# Patient Record
Sex: Female | Born: 1970 | Race: White | Hispanic: No | Marital: Married | Smoking: Never smoker
Health system: Southern US, Community
[De-identification: ages and names within clinical notes are randomized; demographics above are authoritative.]

## PROBLEM LIST (undated history)

## (undated) DIAGNOSIS — I471 Supraventricular tachycardia, unspecified: Secondary | ICD-10-CM

## (undated) HISTORY — DX: Supraventricular tachycardia, unspecified: I47.10

## (undated) HISTORY — PX: SPINAL FUSION: SHX223

## (undated) HISTORY — DX: Supraventricular tachycardia: I47.1

---

## 1999-03-02 ENCOUNTER — Emergency Department (HOSPITAL_COMMUNITY): Admission: EM | Admit: 1999-03-02 | Discharge: 1999-03-02 | Payer: Self-pay | Admitting: Emergency Medicine

## 1999-08-20 ENCOUNTER — Emergency Department (HOSPITAL_COMMUNITY): Admission: EM | Admit: 1999-08-20 | Discharge: 1999-08-20 | Payer: Self-pay | Admitting: *Deleted

## 1999-12-08 ENCOUNTER — Emergency Department (HOSPITAL_COMMUNITY): Admission: EM | Admit: 1999-12-08 | Discharge: 1999-12-08 | Payer: Self-pay | Admitting: Emergency Medicine

## 2000-10-25 HISTORY — PX: KIDNEY STONE SURGERY: SHX686

## 2003-10-26 HISTORY — PX: APPENDECTOMY: SHX54

## 2004-02-06 ENCOUNTER — Inpatient Hospital Stay (HOSPITAL_COMMUNITY): Admission: RE | Admit: 2004-02-06 | Discharge: 2004-02-10 | Payer: Self-pay | Admitting: Neurological Surgery

## 2004-03-30 ENCOUNTER — Encounter: Admission: RE | Admit: 2004-03-30 | Discharge: 2004-03-30 | Payer: Self-pay | Admitting: Neurological Surgery

## 2004-05-11 ENCOUNTER — Encounter: Admission: RE | Admit: 2004-05-11 | Discharge: 2004-05-11 | Payer: Self-pay | Admitting: Neurological Surgery

## 2004-08-29 ENCOUNTER — Inpatient Hospital Stay (HOSPITAL_COMMUNITY): Admission: EM | Admit: 2004-08-29 | Discharge: 2004-08-30 | Payer: Self-pay | Admitting: Family Medicine

## 2004-08-29 ENCOUNTER — Encounter (INDEPENDENT_AMBULATORY_CARE_PROVIDER_SITE_OTHER): Payer: Self-pay | Admitting: *Deleted

## 2004-10-21 ENCOUNTER — Inpatient Hospital Stay (HOSPITAL_COMMUNITY): Admission: RE | Admit: 2004-10-21 | Discharge: 2004-10-25 | Payer: Self-pay | Admitting: Neurological Surgery

## 2004-11-03 ENCOUNTER — Encounter: Admission: RE | Admit: 2004-11-03 | Discharge: 2004-11-03 | Payer: Self-pay | Admitting: Neurological Surgery

## 2004-12-25 ENCOUNTER — Other Ambulatory Visit: Admission: RE | Admit: 2004-12-25 | Discharge: 2004-12-25 | Payer: Self-pay | Admitting: Gynecology

## 2004-12-28 ENCOUNTER — Encounter: Admission: RE | Admit: 2004-12-28 | Discharge: 2004-12-28 | Payer: Self-pay | Admitting: Neurological Surgery

## 2005-02-14 ENCOUNTER — Emergency Department (HOSPITAL_COMMUNITY): Admission: EM | Admit: 2005-02-14 | Discharge: 2005-02-14 | Payer: Self-pay | Admitting: Emergency Medicine

## 2005-03-08 ENCOUNTER — Encounter: Admission: RE | Admit: 2005-03-08 | Discharge: 2005-03-08 | Payer: Self-pay | Admitting: Neurological Surgery

## 2005-05-06 ENCOUNTER — Encounter: Admission: RE | Admit: 2005-05-06 | Discharge: 2005-05-06 | Payer: Self-pay | Admitting: Internal Medicine

## 2005-07-01 ENCOUNTER — Encounter: Admission: RE | Admit: 2005-07-01 | Discharge: 2005-07-01 | Payer: Self-pay | Admitting: Neurological Surgery

## 2005-11-20 ENCOUNTER — Emergency Department (HOSPITAL_COMMUNITY): Admission: EM | Admit: 2005-11-20 | Discharge: 2005-11-20 | Payer: Self-pay | Admitting: Emergency Medicine

## 2005-12-01 IMAGING — RF DG LUMBAR SPINE 2-3V
1 series · 2 of 2 positions shown · non-contrast
Comparison: none

CLINICAL DATA: L4-5 PLIF.
LUMBAR SPINE-TWO VIEWS:
Please note that these films are submitted postoperatively for interpretation.
Frontal and lateral intraoperative views of the lower lumbar spine demonstrates posterior rod and bipedicular screw fixation with interbody fusion material at L[DATE]-S1.  No definite complicating features are identified.

[Series 1: run · 2 of 2 slices shown]
[im 1/2]
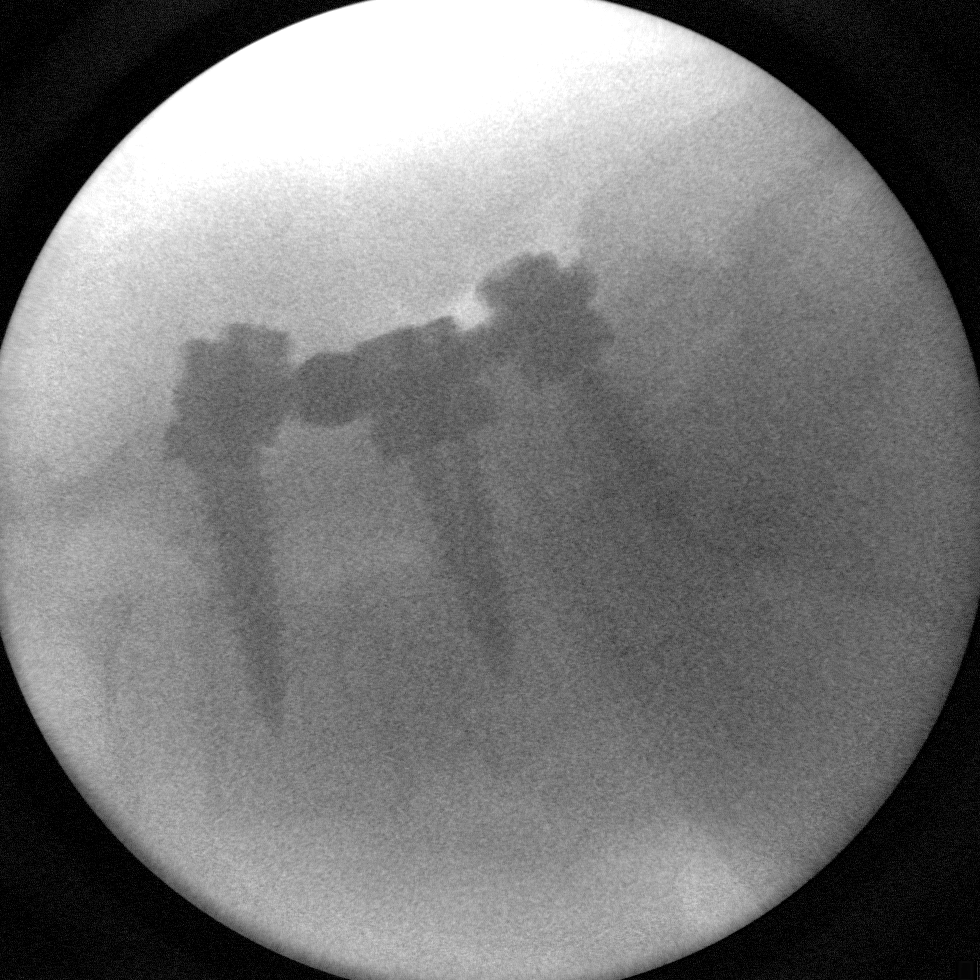
[im 2/2]
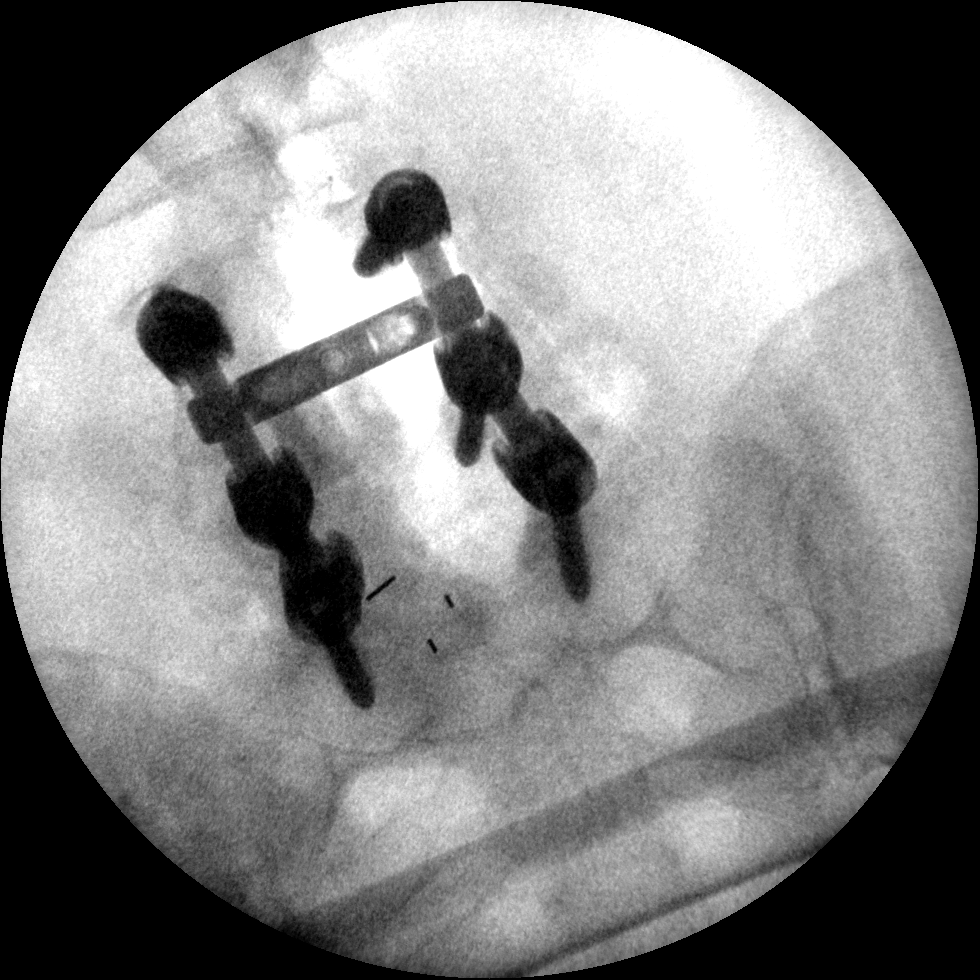

[2 of 2 positions shown; findings below may reference images not displayed]

IMPRESSION: Posterior fusion L-4 to S-1.

## 2006-04-07 ENCOUNTER — Ambulatory Visit (HOSPITAL_COMMUNITY): Admission: RE | Admit: 2006-04-07 | Discharge: 2006-04-07 | Payer: Self-pay | Admitting: Neurosurgery

## 2006-04-21 ENCOUNTER — Ambulatory Visit (HOSPITAL_COMMUNITY): Admission: RE | Admit: 2006-04-21 | Discharge: 2006-04-22 | Payer: Self-pay | Admitting: Neurosurgery

## 2006-08-19 ENCOUNTER — Ambulatory Visit (HOSPITAL_COMMUNITY): Admission: RE | Admit: 2006-08-19 | Discharge: 2006-08-19 | Payer: Self-pay | Admitting: Neurosurgery

## 2006-10-19 ENCOUNTER — Ambulatory Visit (HOSPITAL_COMMUNITY): Admission: RE | Admit: 2006-10-19 | Discharge: 2006-10-19 | Payer: Self-pay | Admitting: Neurosurgery

## 2006-10-25 ENCOUNTER — Emergency Department (HOSPITAL_COMMUNITY): Admission: EM | Admit: 2006-10-25 | Discharge: 2006-10-25 | Payer: Self-pay | Admitting: Emergency Medicine

## 2006-10-25 HISTORY — PX: GALLBLADDER SURGERY: SHX652

## 2006-11-15 ENCOUNTER — Ambulatory Visit (HOSPITAL_COMMUNITY): Admission: RE | Admit: 2006-11-15 | Discharge: 2006-11-16 | Payer: Self-pay | Admitting: Neurosurgery

## 2006-12-31 IMAGING — CR DG LUMBAR SPINE COMPLETE 4+V
5 series · 5 of 5 positions shown · non-contrast
Comparison: 07/01/05.

CLINICAL DATA: Status post fall; back pain.
 LUMBAR SPINE ? 4 VIEWS:

[view not recorded (1 of 5)]
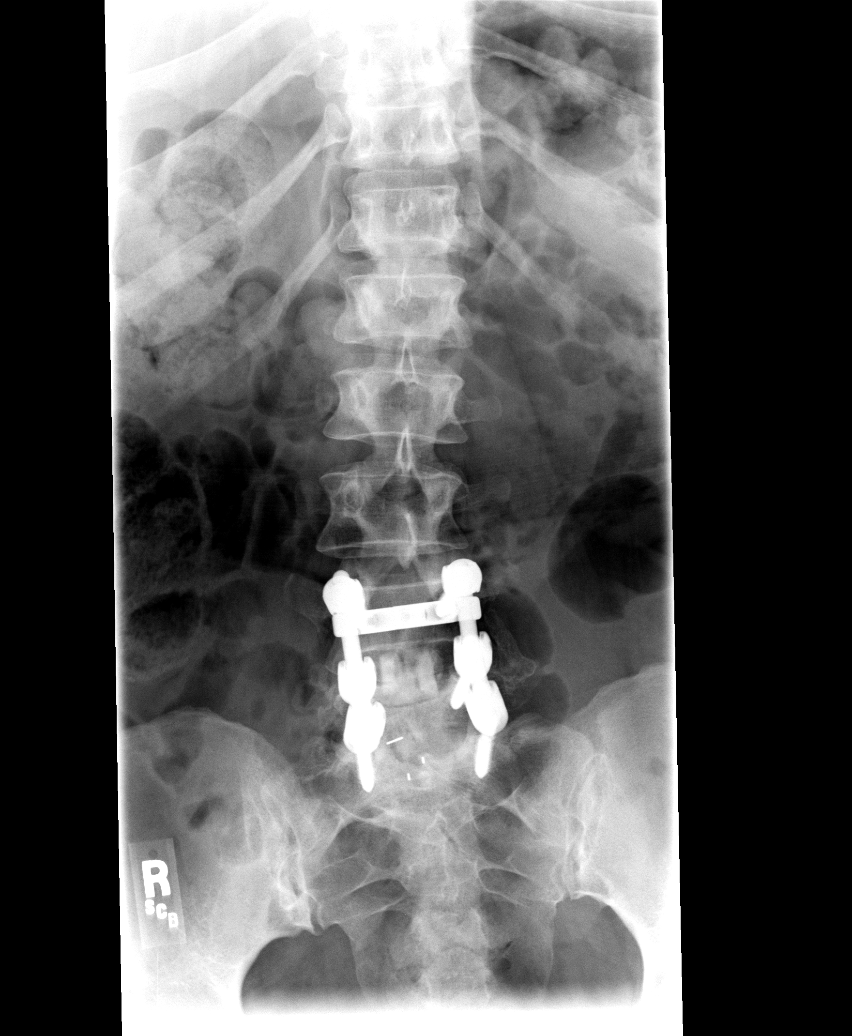

[view not recorded (2 of 5)]
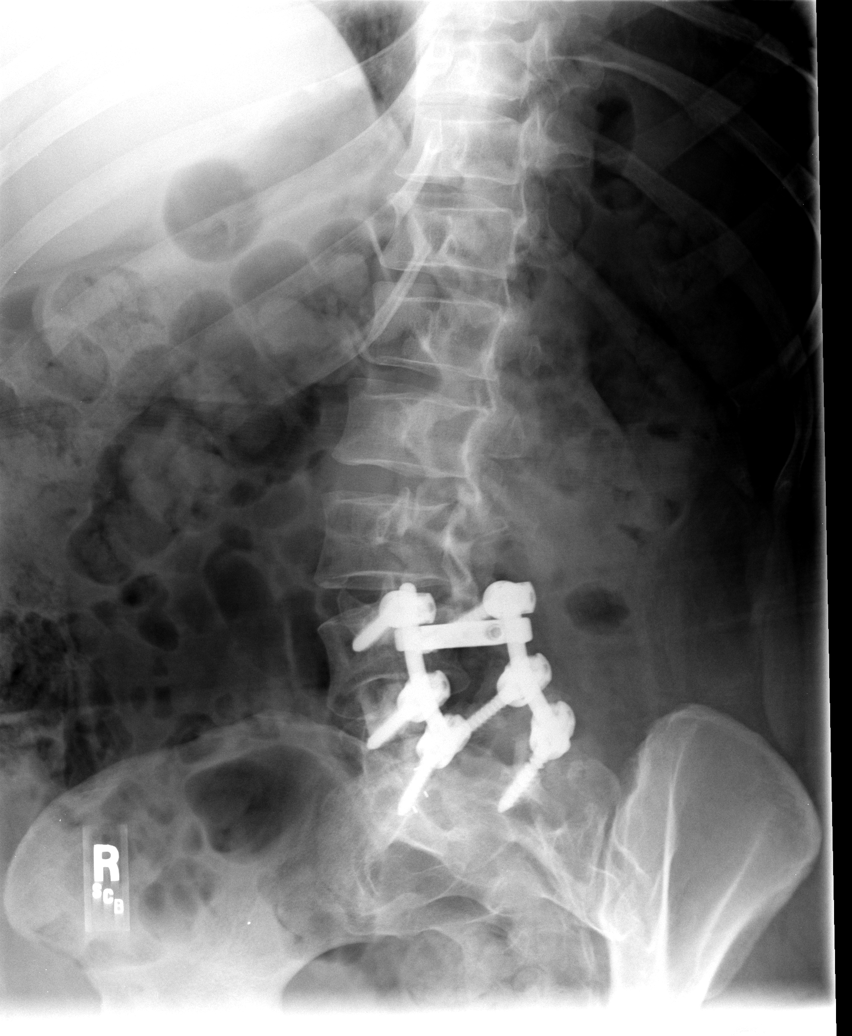

[view not recorded (3 of 5)]
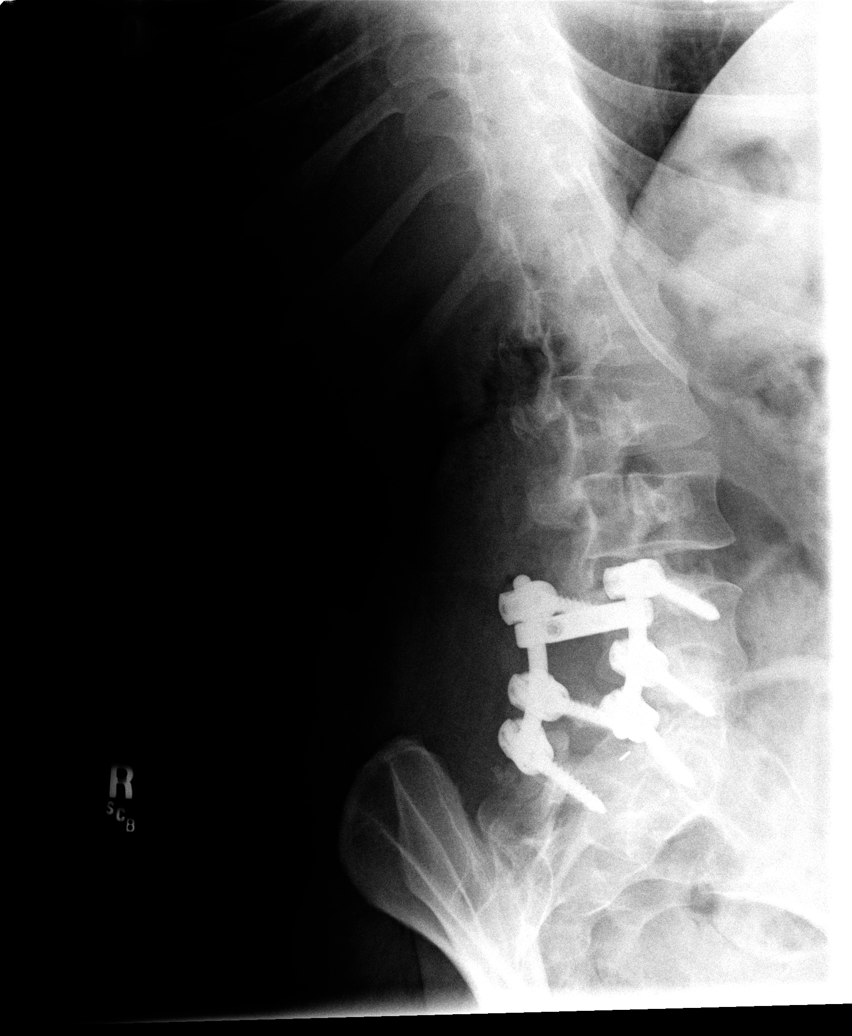

[view not recorded (4 of 5)]
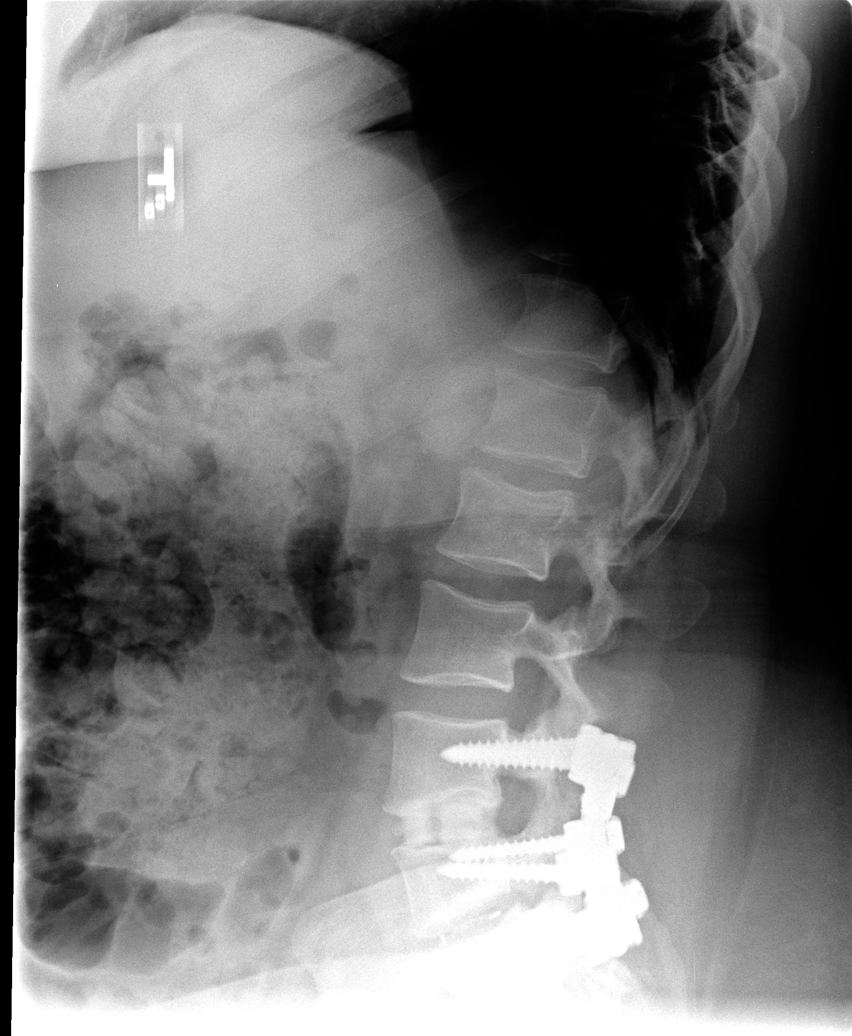

[view not recorded (5 of 5)]
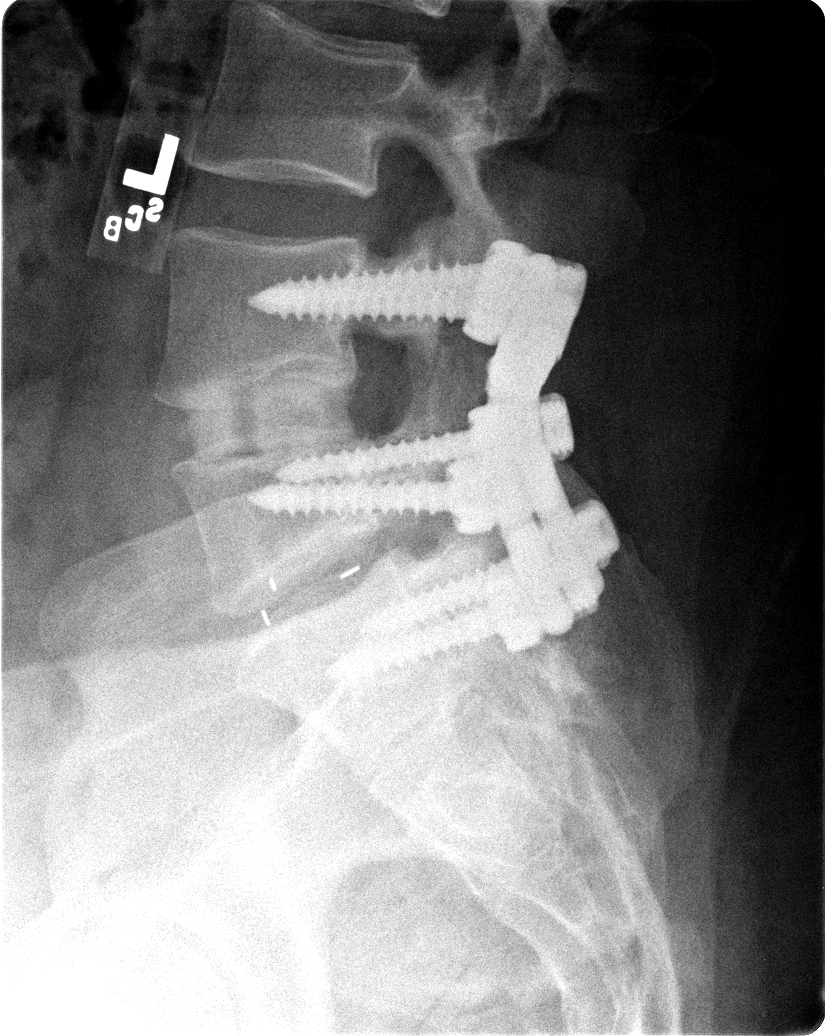

[5 of 5 positions shown; findings below may reference images not displayed]

FINDINGS: PLIF has been performed from the L4 through S1 level.  When compared with the examination dated 07/01/05, there has been further interval incorporation of interfusion body plug into the disk space at L4-5. Cage material is seen at the L5-S1 level.  This is stable in appearance.  The transpedicular screws and Harrington rods are unchanged in appearance.
 The alignment of the lumbar spine is normal.  The vertebral body heights and disk spaces are well preserved.  No acute findings are identified.  Specifically, I see no fractures or dislocations.
IMPRESSION: Stable posterior laminectomy and interbody fusion from L4 through S1.  No acute findings noted.

## 2007-06-01 IMAGING — RF DG THORACIC SPINE 1V
1 series · 1 of 1 positions shown · non-contrast
Comparison: none

CLINICAL DATA: Stimulator insertion

Thoracic spine intraoperative:
Single intraoperative image from C-arm fluoroscopy documents placement of a
spinal stimulator, projecting over the thoracic spine.

[Series 1: run · 1 of 1 slices shown]
[im 1/1]
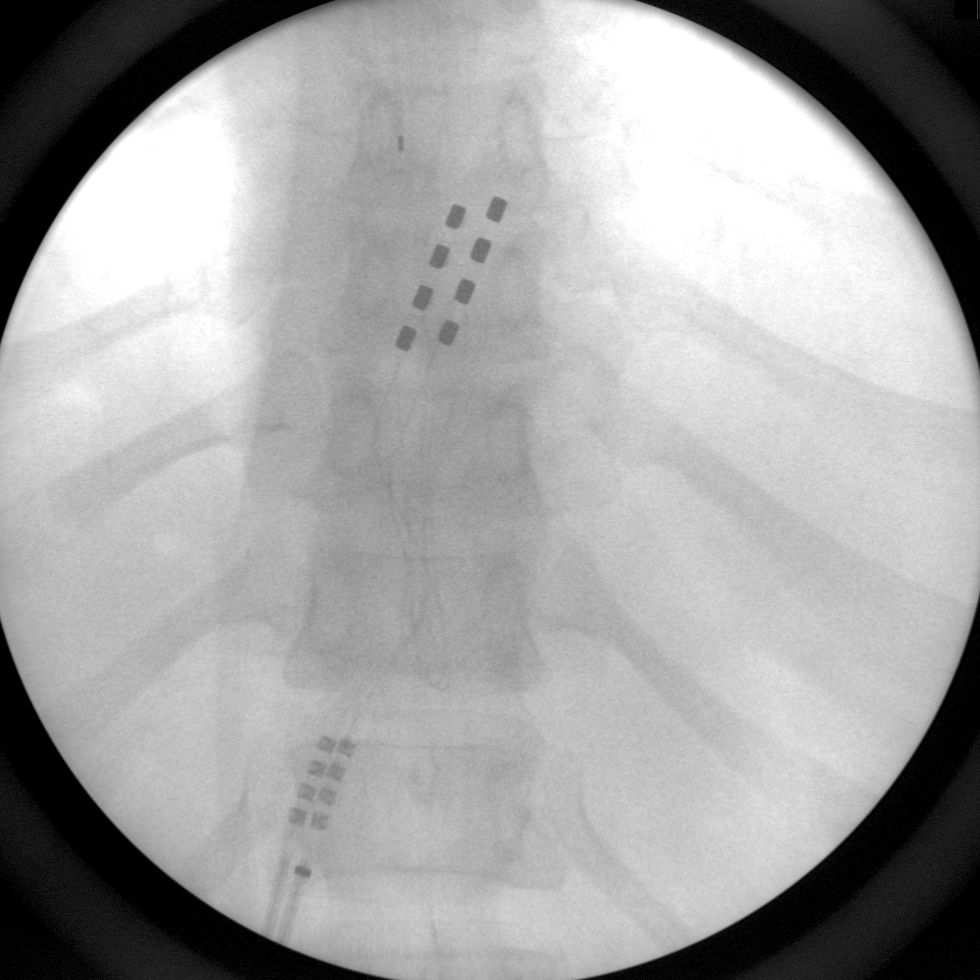

[1 of 1 positions shown; findings below may reference images not displayed]

IMPRESSION: Thoracic stimulator placement

## 2007-07-17 ENCOUNTER — Encounter: Admission: RE | Admit: 2007-07-17 | Discharge: 2007-07-17 | Payer: Self-pay | Admitting: Neurological Surgery

## 2010-09-09 ENCOUNTER — Ambulatory Visit: Payer: Self-pay | Admitting: Obstetrics and Gynecology

## 2011-02-16 ENCOUNTER — Ambulatory Visit: Payer: Self-pay

## 2011-03-12 NOTE — Discharge Summary (Signed)
NAMEBLANCH, STANG                       ACCOUNT NO.:  [id number]   MEDICAL RECORD NO.:  [id number]                   PATIENT TYPE:  INP   LOCATION:  3006                                 FACILITY:  [facility]   PHYSICIAN:  Tia Alert, MD                  DATE OF BIRTH:  July 18, 1971   DATE OF ADMISSION:  02/06/2004  DATE OF DISCHARGE:  02/10/2004                                 DISCHARGE SUMMARY   ADMISSION DIAGNOSES:  Lytic spondylolisthesis, L5-S1, with degenerative disk  disease, back pain, and radiculopathy.   PROCEDURE:  TLIF, L5-S1.   BRIEF HISTORY OF PRESENT ILLNESS:  Patricia [name] is a 40 year old white female  who presented to the neurosurgical clinic with complaints of back pain and  right leg pain in a S1 distribution. She had been followed by Dr. Joaquim Nam  for quite some time and had been treated medically. She had a MRI and a  diskogram which showed a Grade I spondylolisthesis at L5-S1 with lytic pars  defects. Because of her back pain and radiculopathy and her segmental  instability, I recommended a Gill decompression and instrumented fusion at  L5-S1. She understood the risks, benefits, and alternatives and wished to  proceed.   HOSPITAL COURSE:  The patient was admitted on February 06, 2004 and taken to  the operating room where she underwent a Gill decompression and instrumented  fusion at L5-S1. The patient tolerated the procedure well, and she was taken  to the recovery room and then to the floor in stable condition. For details  of the operative procedure, please see the dictated operative note. The  patient's hospital course was routine. There were no complications. She  remained at bedrest the first night with a Foley catheter in place. The  following morning, she was allowed out of bed. Her Foley catheter was  discontinued. She began to mobilize. Her followup hemoglobin was 11.0. She  remained afebrile with stable vital signs. She tolerated a regular diet. She  continued to increase her activities and was discharged to home on February 10, 2004.   DISCHARGE MEDICATIONS:  1. Percocet 5/325 1 to 2 p.o. q.6h. p.r.n. pain, 100 pills with no refills.  2. Valium 5 mg p.o. t.i.d. p.r.n. spasms, 60 pills and 2 refills.  3. Phenergan 25 mg p.o. or p.r. q.6h. p.r.n. nausea, 20 pills with no     refill.   FOLLOW UP:  Her followup is in two weeks with me.   FINAL DIAGNOSES:  Lytic spondylolisthesis, L5-S1, status post TLIF L5-S1.                                                Tia Alert, MD    DSJ/MEDQ  D:  02/10/2004  T:  02/11/2004  Job:  [id number]

## 2011-03-12 NOTE — Op Note (Signed)
Patricia [name], Patricia [name]             ACCOUNT NO.:  [id number]   MEDICAL RECORD NO.:  [id number]          PATIENT TYPE:  AMB   LOCATION:  SDS                          FACILITY:  [facility]   PHYSICIAN:  Reinaldo Meeker, M.D. DATE OF BIRTH:  Apr 06, 1971   DATE OF PROCEDURE:  04/07/2006  DATE OF DISCHARGE:                                 OPERATIVE REPORT   PREOPERATIVE DIAGNOSIS:  Failed back syndrome.   POSTOPERATIVE DIAGNOSIS:  Failed back syndrome.   PROCEDURE:  1.  Percutaneous spinal stimulator trial.  2.  Programming greater than one hour.   SURGEON:  Reinaldo Meeker, M.D.   DESCRIPTION OF PROCEDURE:  After being placed in the prone position, the  patient's back was prepped and draped in the usual sterile fashion.  Local  anesthetic was infiltrated after AP and lateral fluoroscopy showed a good  entry point.   A Tuohy needle was passed down into the epidural space.  A loss of  resistance syringe was used to confirm placement.  Many attempts to pass the  lead in the epidural space were not successful in spite of changing the  wire, etc.  Rather than stopping the procedure, it was elected to remove the  Tuohy needle once and do a more flat angle of entry.  This was done, and  this time we were able to pass the lead into the appropriate position.  Testing was then carried out, and the patient felt some good tingling down  her right leg which is one of her more symptomatic areas.  It was elected,  therefore, to leave the lead in this position.  The wire was removed  followed by the Tuohy needle, and then a small locking collar was placed  around the lead and the lead secured to the skin in two places.  A sterile  dressing was then applied, and the patient was taken to the recovery room in  stable condition.           ______________________________  Reinaldo Meeker, M.D.     ROK/MEDQ  D:  04/07/2006  T:  04/07/2006  Job:  [id number]

## 2011-03-12 NOTE — Op Note (Signed)
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   PROCEDURE:  1.  Exploration of fusion, L5-S1.  2.  Removal of hardware, L5-S1.  3.  Decompressive lumbar laminectomy, hemifacetectomy, and foraminotomies,      L4-5 for central canal and nerve root decompression, requiring a more      extensive laminectomy than would otherwise needed to be done for simple      posterior lumbar interbody fusion.  4.  Posterior lumbar interbody fusion at L4-5 with tangent allograft bone      wedges, local autograft, and DBM putty.  5.  Inner transverse fusion L4 to S1 utilizing local allograft and DBM      putty.  6.  Segmental fixation with reinsertion of hardware L4 to S1 bilaterally      utilizing Sofamor Danek screws.   SURGEON:  Dr. Marikay Alar.   ASSISTANT:  Dr. Donalee Citrin   ANESTHESIA:  General endotracheal.   COMPLICATIONS:  None apparent.   INDICATION FOR THE PROCEDURE:  [name] is a 40 year old white female, who  had undergone a previous PLIF at L5-S1 about 8-9 months ago.  She  preoperatively had a lytic spondylolistheis at L5 and S1 but had a diskogram  which was positive for diskogenic back pain at L4-5 and L5-S1, and I  recommended a simple PLIF at L5-S1 at that time given her young age and the  fact that I felt most of her symptoms were likely coming from the lytic  spondylolisthesis.  We decided to forego the fusion  at L4-5 at that time.  Following the surgery, she had a slowly progressive declining course where  she had more and more back pain.  I felt this was likely secondary to  adjacent level pressures created by the fusion of L5-S1 on an already  degenerated disk that had a positive diskogram prior to her first procedure.  She also had a follow up CT myelogram which showed no evidence of hardware  failure or screw loosening or pseudoarthrosis, but it did show some adjacent  level stenosis at L4-5 with compression of the bilateral L5 nerve roots.  I  recommended a lumbar reexploration with posterior lumbar antibody fusion at  L4-5 with reinsertion of hardware for L4 to S1.  She understood the risks,  benefits, and expected outcome and wished to proceed.   DESCRIPTION OF PROCEDURE:  The patient was taken to the operating room and  after the induction of adequate generalized endotracheal anesthesia, she was  rolled in  the prone position on the [employer] and all pressure points  were padded, and her lumbar region was prepped with DuraPrep and then draped  in the usual sterile fashion.  Local anesthesia 10 mL was injected, and then  a dorsal midline incision was made and carried down to the lumbar sacral  fascia.  The fascia was opened, and the paraspinous musculature was taken  down in a subperiosteal fashion to expose the L4-5 interspace and the L5-S1  interspace.  I dissected out over the transverse processes of L4 and then  was able to identify the hardware inferiorly at L5 and S1.  There was quite  a bit of scar tissue.  The locking cap at L5 on the right was noted to be  loose, and this was removed.  We then removed the cross-link, removed the  other locking caps, and the rocks.  We then tested the screws with a Leksell  rongeur, and these seemed to be solid and had a good nice, tight feel.  Once  the exploration and the fusion was done and the hardware removal was done at  L5-S1, we used  the combination of the Leksell rongeur and the Kerrison  punches to perform a complete laminectomy, hemifacetectomy, and  foraminotomies at L4-5.  The L4 and L5 nerve roots were identified and  decompressed.  Once this decompression was complete, we coagulated the  epidural venous vasculature, cut the disk space bilaterally, and performed  the initial diskectomy with the pituitary rongeurs and then distracted the  disk space up to 12 mm.  And then on the patient's right side, we prepared  the endplates utilizing the round scraper, curettes, pituitary rongeurs, and  then the 12 mm cutting chisel.  We then placed a 12 x 26 mm tangent  allograft bone wedged into the interspace at L4-5 on the right side.  On the  left side, we incised the disk space and prepared the endplates in the same  way utilizing the rotating cutter, round scraper, and 12 mm cutting chisel.  We then packed the midline with local autograft mixed with DBM putty and  then used another 12 x 26 mm tangent allograft bone wedge and tapped this  into the interspace at L4-5 on the left.  Once the PLIF was complete, we  turned our attention to the segmental fixation.  We localized the pedicle  screw zones at L4 bilaterally.  We probed each pedicle, tapped each pedicle  with a 5.5, and then placed six 5 x 40 mm pedicle screws into the pedicles  of L4 bilaterally and checked these with fluoroscopy.  We then decorticated  the transverse processes of L4 and L5 and the sacral ala bilaterally and  used a mixture of local autograft and DBM putty and placed these out over  these for intertransverse arthrodesis from L4 to S1.  We then used two  Lordotic rods and placed these into the multi-axial screw heads of L4, L5,  and S1 pedicle screws and locked these into position with the locking caps  and the antitorque device.  We then placed a separate cross-link for added  stability.  We then irrigated with copious amounts of  Bacitracin-containing saline solution, inspected the nerve roots once again, and then placed a  medium Hemovac drain through a separate stab incision and closed the muscle  and then the fascia with interrupted #1 Vicryl.  We closed the subcutaneous  and subcuticular tissue with 2-0 and 3-0 Vicryl and  closed the skin with  Benzoin and Steri-Strips.  The drapes were removed.  A sterile dressing was  applied.  The patient was awakened from general anesthesia and transported  to the recovery room in stable condition.  At the end of the procedure, all  sponge, needle, and instrument counts were correct.      Markus.Osmond   DSJ/MEDQ  D:  10/21/2004  T:  10/21/2004  Job:  [id number]

## 2011-03-12 NOTE — Op Note (Signed)
Patricia [name], Patricia [name]             ACCOUNT NO.:  [id number]   MEDICAL RECORD NO.:  [id number]          PATIENT TYPE:  INP   LOCATION:  1824                         FACILITY:  [facility]   PHYSICIAN:  Sharlet Salina T. Hoxworth, M.D.DATE OF BIRTH:  19-Oct-1971   DATE OF PROCEDURE:  08/29/2004  DATE OF DISCHARGE:                                 OPERATIVE REPORT   PREOPERATIVE DIAGNOSIS:  Acute appendicitis.   POSTOPERATIVE DIAGNOSIS:  Acute appendicitis.   PROCEDURE:  Laparoscopic appendectomy.   ANESTHESIA:  General.   INDICATIONS FOR PROCEDURE:  Ms. Kristine Linea is a 40 year old female who presents  with acute right lower quadrant abdominal pain and CT scan has shown  evidence of acute appendicitis.  Laparoscopic appendectomy has been  recommended and accepted.  The procedure, its indications, risks of bleeding  and infection were discussed and understood. She is now brought to the  operating room for this procedure.   DESCRIPTION OF PROCEDURE:  The patient was taken to the operating room,  placed in the  supine position on the operating table, and general  endotracheal anesthesia was induced. She received preoperative antibiotics.  Foley catheter was placed. The abdomen was widely sterilely prepped and  draped.  Local anesthesia was infiltrated at trocar sites prior to the  incisions.   A 1 cm incision was made just above the umbilicus and dissection carried  down to the midline fascia, which was incised for 1 cm with peritoneum  entered under direct vision.  Through a mattress suture of  0-Vicryl, the Hasson trocar was placed and pneumoperitoneum established.  Under direct vision, a 5 mm trocar was placed in the right upper quadrant  and a 12 mm trocar in the left lower quadrant.  There was a small amount of  cloudy fluid in the right pericolic gutter.  The appendix was found, coming  off the anterior portion of the cecum, and was acutely inflamed with  exudate, but not perforated.  There  were inflammatory attachments laterally,  inferiorly that were broken up with careful blunt dissection.   The appendix at this point was quite free, down to the base, which was not  inflamed.  The mesoappendix was then sequentially divided between the  Harmonic Scalpel until the appendix was completely freed down to its base.  The appendix was then separated from the cecum with a single firing of 35 mm  blue load, and the stapler fired across the base.  Staple line was intact without bleeding.  The appendix was placed in an Endo-  catch bag and brought out through the umbilicus. The right lower quadrant  was thoroughly irrigated and suctioned until clear, hemostasis assured.  Trocars  were removed under direct vision and all CO2 evacuated.  Mattress suture was  secured at the umbilicus.  Skin incisions were closed with interrupted  subcuticular 4-0 Vicryl and Steri-Strips. Sponge and needle counts were  correct.  The patient was taken to recovery in good condition.       BTH/MEDQ  D:  08/29/2004  T:  08/30/2004  Job:  [id number]

## 2011-03-12 NOTE — Discharge Summary (Signed)
[name], [name]             ACCOUNT NO.:  [id number]   MEDICAL RECORD NO.:  [id number]          PATIENT TYPE:  INP   LOCATION:  3009                         FACILITY:  [facility]   PHYSICIAN:  Tia Alert, MD     DATE OF BIRTH:  10-31-1970   DATE OF ADMISSION:  10/21/2004  DATE OF DISCHARGE:  10/25/2004                                 DISCHARGE SUMMARY   ADMISSION DIAGNOSIS:  Adjacent level stenosis with degenerative disk  disease, L4-5 with severe back pain.   PROCEDURE:  Lumbar reexploration with exploration of fusion, L5-S1 with  decompression and instrument effusion at L4-S1.   HISTORY OF PRESENT ILLNESS:  [name] is a 40 year old white female who  had undergone a previous PLIF at L5-S1.  She had a previous diskogram which  was positive at L4-5 and L5-S1.  However, she had a loaded spondylolisthesis  at L5-S1 and I felt that her pain was most likely coming mostly from the L5-  S1 level.  However postoperatively, she was unable to get off narcotic pain  medications.  She had continued severe back pain.  She had a CT myelogram  which showed some adjacent level stenosis at L4-5.  Because of her continued  symptoms and her positive diskogram and her developing adjacent level  stenosis at L4-L5, I recommended a lumbar reexploration of a decompression  at L4-5 followed by instrument effusion of L4-S1.  She understood the risks,  benefits, expected outcome and wished to proceed.   HOSPITAL COURSE:  The patient was admitted on October 21, 2004, and taken  to the operating room where she underwent a PLIF at L4-5 with extension of  her fusion.  The patient tolerated the procedure well and was taken to the  recovery room and then to the floor in stable condition.  For the details of  the operative procedure, please see the dictated operative note.  The  patient's hospital course was routine.  There were no complications.  She  slowly began to increase her activities over her four  days in the hospital  to the point that she was ambulating in the halls without difficulty.  She  had good dorsi and plantar flexion and strength.  She had no numbness,  tingling or weakness or any leg pain.  She complained of significant back  soreness.  She had a PCA for the first couple days for which she received  Dilaudid.  She was then taken off the Dilaudid PCA and started back on her  OxyContin, Percocet and Valium that she was taken at home.  Her  postoperative hemoglobin was 11.2 on postoperative day #2.  Her incision  remained clean, dry and intact.  She continued along this course and was  discharged to home in stable condition on postoperative day #4, which was  10/25/04.   DISCHARGE MEDICATIONS:  1.  OxyContin 40 mg p.o. b.i.d. p.r.n. pain.  2.  Percocet 5/325 mg one to two p.o. q.6h. p.r.n. pain.  3.  Valium 5 mg one to two p.o. t.i.d. p.r.n. muscle spasm.   FOLLOWUP PLAN:  Her return  appointment is in one week with x-rays.  She was  asked to call for any unusual redness, tenderness, swelling or drainage from  her wound or any temperature of 101.5.  All of her discharge instructions  were explained to the patient.  She demonstrated an understanding and all of  her questions were answered to her satisfaction.   DISPOSITION:  To home.   CONDITION ON DISCHARGE:  Stable.   FINAL DIAGNOSIS:  Degenerative disk disease, adjacent stenosis and back  pain, status posterior lumber interbody fusion at L4-5 with extension of  fusion.      Patricia [name]   DSJ/MEDQ  D:  10/25/2004  T:  10/25/2004  Job:  [id number]

## 2011-03-12 NOTE — H&P (Signed)
[name], [name]             ACCOUNT NO.:  [id number]   MEDICAL RECORD NO.:  [id number]          PATIENT TYPE:  INP   LOCATION:  1824                         FACILITY:  [facility]   PHYSICIAN:  Sharlet Salina T. Hoxworth, M.D.DATE OF BIRTH:  05/17/1971   DATE OF ADMISSION:  08/29/2004  DATE OF DISCHARGE:                                HISTORY & PHYSICAL   CHIEF COMPLAINT:  Right lower quadrant abdominal pain.   HISTORY OF PRESENT ILLNESS:  [name] is a 40 year old white female who  presents to the [facility] emergency room with an approximately five-day  long illness. This began initially with what she describes as flu symptoms  with generalized aching, nausea, fever, and diarrhea. These symptoms  continued for several days, but then last night about 12 hours prior to  admission developed more severe right lower quadrant abdominal pain which  has persisted. She remains nauseated and has had several episodes of  vomiting. The pain is worse with any motion. She continues to have some  fever. She denies any history of chronic GI complaints or abdominal pain.   PAST MEDICAL HISTORY:  She has had a spinal fusion and has chronic back  pain. She has had bilateral tubal ligation and cystoscopy for kidney stones.   MEDICATIONS:  1.  Valium 5 mg b.i.d.  2.  Oxycodone 20 mg b.i.d.  3.  Combidex 5 mg t.i.d.   ALLERGIES:  SULFA ANTIBIOTICS and some TAPE.   SOCIAL HISTORY:  Married. No alcohol or cigarette use.   FAMILY HISTORY:  Noncontributory.   REVIEW OF SYSTEMS:  Positive fever and malaise with this illness.  RESPIRATORY: Denies shortness of breath, cough, or wheezing, or chest pain.  Denies palpitations. History of heart disease. ABDOMEN/GI: As above.  GU: No  urinary burning, frequency, or bleeding. MUSCULOSKELETAL: Significant for  chronic back pain.   PHYSICAL EXAMINATION:  VITAL SIGNS: Temperature 98.1, pulse 115,  respirations 20, blood pressure 132/93.  GENERAL: She is an  obese female who appears ill and in pain.  SKIN: Warm and dry without rash or infection.  HEENT: No mass or thyromegaly. Sclera anicteric.  LUNGS: Clear to auscultation without any increased work of breathing.  CARDIAC: Regular rate and rhythm without murmurs. No edema.  ABDOMEN: Decreased bowel sounds. There is a well localized significant  tenderness in the right lower quadrant with guarding. No peripheral masses  or organomegaly.  EXTREMITIES: No joint swelling or deformity.  NEUROLOGIC: Alert and oriented. Motor and sensory grossly normal.   LABORATORY DATA:  Pertinent elevated white count of 14.7, normal hemoglobin.  Normal urinalysis. Electrolytes and LFTs unremarkable. CT scan of the  abdomen and pelvis are obtained which show evidence of acute appendicitis  without perforation.   ASSESSMENT/PLAN:  Apparent acute appendicitis. Presentation is a little  unusual, but has significant tenderness in the right lower quadrant. Will  treat with intravenous antibiotics, intravenous fluids, and emergency  appendectomy.       BTH/MEDQ  D:  08/29/2004  T:  08/29/2004  Job:  [id number]

## 2011-03-12 NOTE — Op Note (Signed)
Patricia [name], Patricia [name]             ACCOUNT NO.:  [id number]   MEDICAL RECORD NO.:  [id number]          PATIENT TYPE:  AMB   LOCATION:  SDS                          FACILITY:  [facility]   PHYSICIAN:  Reinaldo Meeker, M.D. DATE OF BIRTH:  06-19-1971   DATE OF PROCEDURE:  10/19/2006  DATE OF DISCHARGE:                               OPERATIVE REPORT   PREOPERATIVE DIAGNOSIS:  Pain secondary to spinal stimulator battery.   POSTOPERATIVE DIAGNOSIS:  Pain secondary to spinal stimulator battery.   PROCEDURE:  Relocation and replacement of spinal stimulator battery  pack.   SURGEON:  Dr. Gerlene Fee.   PROCEDURE IN DETAIL:  After placed in the prone position the patient's  back, buttock and flank were prepped and draped in the usual sterile  fashion.  Previous buttock incision was reopened and carried down to the  battery pack which was removed without difficulty.  It was disconnected  from the extension packet lead.  Fluoroscopy was used to identify the  leads approximately 6 inches proximal to that and a cutdown technique  was used to identify the leads.  These were then withdrawn to that small  opening from the old battery pouch.  A new incision was then made in the  left flank region and area of a fatty soft tissue and a nice pocket was  formed.  The battery was tested in the pocket and found to be a nice  fit.  The lead was then tunneled from the small cut down area over to  the new battery pocket without difficulty and was then attached to the  battery.  The battery was then placed within the pocket found to be a  good fit.  All wounds were then irrigated and closed with interrupted  Vicryl on the subcutaneous and subcuticular tissues and Dermabond on the  skin.  Sterile dressings were applied and the patient was extubated and  taken to recovery room in stable condition.           ______________________________  Reinaldo Meeker, M.D.     ROK/MEDQ  D:  10/19/2006  T:  10/19/2006   Job:  [id number]

## 2011-03-12 NOTE — Op Note (Signed)
Patricia [name], BOMKAMP             ACCOUNT NO.:  [id number]   MEDICAL RECORD NO.:  [id number]          PATIENT TYPE:  AMB   LOCATION:  SDS                          FACILITY:  [facility]   PHYSICIAN:  Reinaldo Meeker, M.D. DATE OF BIRTH:  [birth year]   DATE OF PROCEDURE:  08/19/2006  DATE OF DISCHARGE:  08/19/2006                                 OPERATIVE REPORT   PREOPERATIVE DIAGNOSIS:  Superficial wound irritation from spinal stimulator  battery.   POSTOPERATIVE DIAGNOSIS:  Superficial wound irritation from spinal  stimulator battery.   PROCEDURE:  Revision of spinal stimulator battery pocket.   SURGEON:  Reinaldo Meeker, M.D.   PROCEDURE IN DETAIL:  After being placed in the prone position, the  patient's skin was infiltrated with local anesthetic.  The previous incision  was opened and carried down until the superficial layer superior surface of  the spinal stimulator battery could be identified.  This was dissected free  of soft tissue and actually brought out of the pocket.  The pocket was then  enlarged deep and inferior so that the entire battery would be able to sit  below the incision.  This was generously enlarged with numerous testings of  replacing the battery in there to make sure that it sat smoothly beneath the  skin without any pressure upward where there had been a previous problem.  When this was accomplished, the wound was irrigated with antibiotic  irrigation and the battery replaced back into the pouch.  The wound was then  closed with two layers of interrupted Vicryl on the subcutaneous and  subcuticular tissues and Dermabond on the skin.  A sterile dressing was then  applied and the patient was taken to recovery room in stable condition.           ______________________________  Reinaldo Meeker, M.D.     ROK/MEDQ  D:  08/19/2006  T:  08/20/2006  Job:  [id number]

## 2011-03-12 NOTE — Op Note (Signed)
NAMELUELLA, GARDENHIRE             ACCOUNT NO.:  [id number]   MEDICAL RECORD NO.:  [id number]          PATIENT TYPE:  OIB   LOCATION:  3172                         FACILITY:  [facility]   PHYSICIAN:  Reinaldo Meeker, M.D. DATE OF BIRTH:  12/22/1970   DATE OF PROCEDURE:  04/21/2006  DATE OF DISCHARGE:                                 OPERATIVE REPORT   PREOPERATIVE DIAGNOSIS:  Failed back syndrome.   POSTOPERATIVE DIAGNOSIS:  Failed back syndrome.   PROCEDURE:  1.  Permanent spinal stimulator with paddle lead with thoracic laminectomy.  2.  Implantation with spinal stimulator battery.  3.  Patient programming.   SURGEON:  Reinaldo Meeker, M.D.   ASSISTANT:  None.   PROCEDURE IN DETAIL:  After being placed in the prone position, the  patient's back and upper buttocks were prepped and draped in the usual  sterile fashion.  AP fluoroscopy was used to identify the appropriate level.  Midline incision was made at the spinous processes of T10 and T11.  Using  Bovie cautery current, the incision was carried down to the spinous  processes.  Subperiosteal dissection was carried out on the left side  spinous processes and lamina and self-retaining retractor was placed for  exposure.  X-ray showed approach through a good level and left-sided  hemilaminectomy of T10 was performed.  Attempts to slide the knee into a  good position kept having to lead far off to the left side which was not  felt to be ideal.  Subperiosteal dissection was then carried out on the  right so that bilateral exposure was present.  Spinous process was then  removed and a partial right hemilaminectomy was performed.  Attempts to  place the lead in this position allowed excellent access to both the left  and right side and midline.  A pocket at this time was then made in the  buttock for the battery and fashioned in appropriate fashion.  This was  tested and found to be a good size.  The passer was then passed from a  superior to inferior direction connecting the two incisions and the  extension set was then brought up without difficulty.  The lead was then  placed into good position and then the connections made with  boots being  placed over the extension connection.  The distal end of the lead was then  implanted into the battery.  Both wounds were then irrigated copiously.  They were closed in multiple layers with Vicryl in the muscle, fascia and  subcutaneous subcuticular tissues and staples were placed on the skin.  Prior to placing the last layer closure, one final AP x-ray showed the lead  had not been dislodged at all.  Staples were then placed on the skin.  Sterile dressing was then applied.  The patient was extubated and taken to  recovery room in stable condition.           ______________________________  Reinaldo Meeker, M.D.     ROK/MEDQ  D:  04/21/2006  T:  04/21/2006  Job:  [id number]

## 2011-03-12 NOTE — Op Note (Signed)
Patricia [name], [name]                       ACCOUNT NO.:  [id number]   MEDICAL RECORD NO.:  [id number]                   PATIENT TYPE:  INP   LOCATION:  2899                                 FACILITY:  [facility]   PHYSICIAN:  Tia Alert, MD                  DATE OF BIRTH:  1970-11-30   DATE OF PROCEDURE:  02/06/2004  DATE OF DISCHARGE:                                 OPERATIVE REPORT   PREOPERATIVE DIAGNOSIS:  Lytic spondylolisthesis, L5-S1 with degenerative  disk disease, back pain and radiculopathy.   POSTOPERATIVE DIAGNOSIS:  Lytic spondylolisthesis, L5-S1 with degenerative  disk disease, back pain and radiculopathy.   OPERATION PERFORMED:  1. Gill decompression, L5-S1 for central canal and nerve root decompression     for back pain and radiculopathy.  2. Transforaminal lumbar interbody fusion, L4-5 on the right utilizing a 12     x 26 mm PEEK interbody cage, packed with local autograft and MasterGraft     bone graft extender.  3. Intertransverse arthrodesis, L5-S1 on the right utilizing MasterGraft and     local autograft.  4. Nonsegmental fixation, L5-S1 utilizing the Legacy pedicle screw system.   SURGEON:  Tia Alert, MD   ASSISTANT:  Reinaldo Meeker, M.D.   ANESTHESIA:  General endotracheal.   COMPLICATIONS:  None apparent.   INDICATIONS FOR PROCEDURE:  Patricia [name] is a 40 year old white female who  presented to the neurosurgical clinic with complaints of back pain and right  leg pain in an L5-S1 distribution.  She had been followed by Patricia [name],  M.D. for quite some time and had been treated medically.  She had an MRI and  a discogram which showed a grade 1 spondylolisthesis at L5-S1 with a lytic  pars defect.  Because of her back pain and her radiculopathy and her  segmental instability, I recommended a Gill decompression and instrumented  fusion at L5-S1.  She understood the risks, the benefits, and the expected  outcome and wished to proceed.   DESCRIPTION OF PROCEDURE:  The patient was taken to the operating room and  after induction of adequate general endotracheal anesthesia, she was rolled  into the prone position on the Wilson frame and all pressure points were  padded and the lumbar region was prepped with DuraPrep and then draped in  the usual sterile fashion.  10 mL of local anesthesia was injected and then  a dorsal midline incision was made and carried down to the lumbosacral  fascia.  The fascia was opened and the paraspinous musculature was taken  down in a subperiosteal fashion to expose the L5-S1 interspace, I dissected  out over the transverse processes.  The pars defects were obvious and the  spinous process and lamina were free floating.  I used a combination of a  Leksell and Kerrison punches to remove the spinous process and the lamina  and hemifacets.  She had had  quite a bit of epidural venous vasculature and  adipose tissue which was coagulated and removed to expose the underlying L5  and S1 nerve roots.  Then nerve roots were identified and then I carried the  dissection out to the medial pedicle walls of L5 and S1 bilaterally.  Once  the decompression was complete, I turned my attention to the TLIF.  The  epidural venous vasculature on the right was coagulated and cut and the disk  space was opened on the right side at L5-S1 and a thorough diskectomy  performed with a combination of curets and pituitary rongeurs.  Once the  disk space and the end plates were prepared, we packed the interspace with  local autograft and MasterGraft and then tapped a 12 x 26 mm PEAK interbody  cage into the interspace.  The cage was packed with autograft.  This was  done under fluoroscopic guidance.  Once the TLIF was complete, we turned our  attention to the pedicle screws.  The pedicle screw entry zones at L5 and S1  were identified. We probed each pedicle with a pedicle probe and then tapped  each pedicle with a 5.5 tap  except for the left L5 pedicle was tapped with a  4.5 tap and we used a 5.5 x 35 mm pedicle screw in the pedicles of L5 on the  left.  A 6.5 x 35 mm pedicle screw was used on the right and there was some  splitting of the pedicle.  There was no compression of the nerve root and  the nerve root was separated from the pedicle screw.  6.5 x 30 mm pedicles  screws were used in the sacrum bilaterally.  We then decorticated the  patient on the right in the intertransverse space and packed MasterGraft and  autograft there for intertransverse arthrodesis on the right side.  Because  of her body habitus it was very difficult to do this on the patient's left  side.  We then used two lordotic rods, placed these into the multiaxial  screw heads of the pedicle screws and locked these into position with the  locking caps and the antitorque device and placed a separate crosslink for  added stability.  We then dried all bleeding points, irrigated with saline  solution and placed a medium Hemovac drain through a separate stab incision  and then closed the fascia with interrupted #1 Vicryl. We closed the  subcutaneous and subcuticular tissue with 2-0 and 3-0 Vicryl and closed the  skin with benzoin and Steri-Strips.  The drapes were removed and sterile  dressing was applied.  The patient was awakened from general anesthesia and  transported to the recovery room in stable condition.  At the end of the  procedure, all sponge, needle and instrument counts were correct.                                               Tia Alert, MD    DSJ/MEDQ  D:  02/06/2004  T:  02/07/2004  Job:  [id number]

## 2011-03-12 NOTE — Op Note (Signed)
[name], Patricia             ACCOUNT NO.:  [id number]   MEDICAL RECORD NO.:  [id number]          PATIENT TYPE:  OIB   LOCATION:  3005                         FACILITY:  [facility]   PHYSICIAN:  Reinaldo Meeker, M.D. DATE OF BIRTH:  [birth year]   DATE OF PROCEDURE:  DATE OF DISCHARGE:  11/16/2006                               OPERATIVE REPORT   PREOPERATIVE DIAGNOSES:  Infected spinal stimulator battery.   POSTOPERATIVE DIAGNOSES:   PROCEDURE:  Removal of spinal stimulator battery and extension.   SURGEON:  Reinaldo Meeker, M.D.   PROCEDURE IN DETAIL:  After being placed in the prone posthion, the  patient's back and buttock were prepped and draped in the usual sterile  fashion.  Previous incision where the pocket was formed was reopened.  The stimulator battery removed without difficulty.  There was some  obvious exposure of the extension and it was felt necessary to remove  this as well.  A cut was made higher up.  The extension was  disconnected.  It was then removed without difficulty.  Both wounds were  irrigation copiously and then closed with interrupted Vicryl in the  subcutaneous, subcuticular tissues and a running Nylon on the skin.  Sterile dressings were then applied and the patient was extubated, taken  to recovery room in stable condition.           ______________________________  Reinaldo Meeker, M.D.     ROK/MEDQ  D:  02/02/2007  T:  02/02/2007  Job:  [id number]

## 2011-05-29 ENCOUNTER — Emergency Department: Payer: Self-pay | Admitting: Emergency Medicine

## 2011-05-29 ENCOUNTER — Ambulatory Visit: Payer: Self-pay | Admitting: Emergency Medicine

## 2011-07-24 ENCOUNTER — Ambulatory Visit: Payer: Self-pay | Admitting: Specialist

## 2011-09-10 ENCOUNTER — Other Ambulatory Visit: Payer: Self-pay | Admitting: Neurological Surgery

## 2011-09-10 DIAGNOSIS — M5410 Radiculopathy, site unspecified: Secondary | ICD-10-CM

## 2011-09-21 ENCOUNTER — Ambulatory Visit
Admission: RE | Admit: 2011-09-21 | Discharge: 2011-09-21 | Disposition: A | Discharge status: Home / Self Care | Payer: Self-pay | Source: Ambulatory Visit | Attending: Neurological Surgery | Admitting: Neurological Surgery

## 2011-09-21 VITALS — BP 107/80 | HR 65

## 2011-09-21 DIAGNOSIS — M5410 Radiculopathy, site unspecified: Secondary | ICD-10-CM

## 2011-09-21 MED ORDER — IOHEXOL 180 MG/ML  SOLN
20.0000 mL | Freq: Once | INTRAMUSCULAR | Status: AC | PRN
  Administered 2011-09-21: 20 mL via INTRATHECAL

## 2011-09-21 MED ORDER — HYDROMORPHONE HCL 4 MG PO TABS
4.0000 mg | ORAL_TABLET | Freq: Once | ORAL | Status: AC
  Administered 2011-09-21: 4 mg via ORAL

## 2011-09-21 MED ORDER — DIAZEPAM 5 MG PO TABS
10.0000 mg | ORAL_TABLET | Freq: Once | ORAL | Status: AC
  Administered 2011-09-21: 10 mg via ORAL

## 2011-09-21 NOTE — Progress Notes (Addendum)
Patient states she has been off Trazadone for the past three days.  jkl

## 2011-09-21 NOTE — Progress Notes (Signed)
Husband at bedside.  Tolerating crackers and soda.  Repositioned for comfort and medicated for pain.

## 2011-09-21 NOTE — Patient Instructions (Signed)

## 2012-10-25 HISTORY — PX: TOTAL VAGINAL HYSTERECTOMY: SHX2548

## 2012-12-18 ENCOUNTER — Ambulatory Visit: Payer: Self-pay | Admitting: Nurse Practitioner

## 2013-01-05 ENCOUNTER — Ambulatory Visit: Payer: Self-pay | Admitting: Nurse Practitioner

## 2013-02-28 ENCOUNTER — Other Ambulatory Visit: Payer: Self-pay | Admitting: Orthopedic Surgery

## 2013-02-28 DIAGNOSIS — M25572 Pain in left ankle and joints of left foot: Secondary | ICD-10-CM

## 2013-02-28 DIAGNOSIS — R531 Weakness: Secondary | ICD-10-CM

## 2013-02-28 DIAGNOSIS — R609 Edema, unspecified: Secondary | ICD-10-CM

## 2013-03-04 ENCOUNTER — Ambulatory Visit
Admission: RE | Admit: 2013-03-04 | Discharge: 2013-03-04 | Disposition: A | Discharge status: Home / Self Care | Payer: 59 | Source: Ambulatory Visit | Attending: Orthopedic Surgery | Admitting: Orthopedic Surgery

## 2013-03-04 DIAGNOSIS — R609 Edema, unspecified: Secondary | ICD-10-CM

## 2013-03-04 DIAGNOSIS — M25572 Pain in left ankle and joints of left foot: Secondary | ICD-10-CM

## 2013-03-04 DIAGNOSIS — R531 Weakness: Secondary | ICD-10-CM

## 2013-04-16 ENCOUNTER — Ambulatory Visit: Payer: Self-pay | Admitting: Pain Medicine

## 2013-05-23 ENCOUNTER — Ambulatory Visit: Payer: Self-pay | Admitting: Pain Medicine

## 2013-06-06 ENCOUNTER — Ambulatory Visit: Payer: Self-pay | Admitting: Pain Medicine

## 2013-07-06 ENCOUNTER — Ambulatory Visit: Payer: Self-pay | Admitting: Pain Medicine

## 2013-08-02 ENCOUNTER — Ambulatory Visit: Payer: Self-pay | Admitting: Obstetrics and Gynecology

## 2013-08-02 LAB — BASIC METABOLIC PANEL
Anion Gap: 3 — ABNORMAL LOW (ref 7–16)
Creatinine: 0.7 mg/dL (ref 0.60–1.30)
EGFR (African American): >60
EGFR (Non-African Amer.): >60
Osmolality: 272 (ref 275–301)

## 2013-08-02 LAB — CBC
HGB: 14 g/dL (ref 12.0–16.0)
MCH: 31.3 pg (ref 26.0–34.0)
Platelet: 248 10*3/uL (ref 150–440)

## 2013-08-06 ENCOUNTER — Ambulatory Visit: Payer: Self-pay | Admitting: Obstetrics and Gynecology

## 2013-08-24 ENCOUNTER — Ambulatory Visit: Payer: Self-pay | Admitting: Pain Medicine

## 2014-01-11 ENCOUNTER — Encounter (INDEPENDENT_AMBULATORY_CARE_PROVIDER_SITE_OTHER): Payer: Self-pay

## 2014-01-11 ENCOUNTER — Ambulatory Visit (INDEPENDENT_AMBULATORY_CARE_PROVIDER_SITE_OTHER): Payer: 59 | Admitting: Cardiovascular Disease

## 2014-01-11 ENCOUNTER — Encounter: Payer: Self-pay | Admitting: Cardiovascular Disease

## 2014-01-11 ENCOUNTER — Ambulatory Visit: Payer: 59 | Admitting: Cardiovascular Disease

## 2014-01-11 VITALS — BP 118/98 | HR 75 | Ht 65.0 in | Wt 300.5 lb

## 2014-01-11 DIAGNOSIS — I471 Supraventricular tachycardia: Secondary | ICD-10-CM | POA: Insufficient documentation

## 2014-01-11 DIAGNOSIS — I498 Other specified cardiac arrhythmias: Secondary | ICD-10-CM

## 2014-01-11 MED ORDER — DILTIAZEM HCL 30 MG PO TABS: 30.0000 mg | ORAL_TABLET | Freq: Three times a day (TID) | ORAL | Status: AC | PRN

## 2014-01-11 MED ORDER — METOPROLOL TARTRATE 50 MG PO TABS: 50.0000 mg | ORAL_TABLET | Freq: Two times a day (BID) | ORAL | Status: DC

## 2014-01-11 MED ORDER — PROPRANOLOL HCL 20 MG PO TABS: 20.0000 mg | ORAL_TABLET | Freq: Three times a day (TID) | ORAL | Status: AC | PRN

## 2014-01-11 NOTE — Assessment & Plan Note (Signed)
Recent 50 pound weight loss. Encouraged her to keep a strict diet, increase her exercise for continued weight loss

## 2014-01-11 NOTE — Patient Instructions (Addendum)
You are doing well.  Please start metoprolol tartrate 50mg  twice daily Please take propanolol 20mg  up to 3 times a day as needed for tachycardia Please take diltiazem 30mg  up to 3 times a day as needed for tachycardia  If you have an episode of tachycardia, take a propanolol, a diltiazem, or both Consider going to an EMT substation for an EKG and possible adenosine Call the office if you have another episode  Please call us if you have new issues that need to be addressed before your next appt.  Your physician wants you to follow-up in: 6 months.  You will receive a reminder letter in the mail two months in advance. If you don't receive a letter, please call our office to schedule the follow-up appointment.

## 2014-01-11 NOTE — Assessment & Plan Note (Addendum)
Prior records have been requested from  [facility] including Holter monitor, stress test and echo .   Etiology of her tachycardia is unclear. Certainly could have SVT, though unable to exclude episodes of atrial tachycardia, sinus tachycardia or other arrhythmia. She was doing well on metoprolol 50 mg twice a day and we will restart this for her. In addition we have given her a prescription for propranolol 20 mg to take as needed, also diltiazem 30 mg to take as needed.  If she has continued episodes of tachycardia, we have suggested that she call our office. Today Holter or 30 day monitor could be performed. We did discuss possible ablation if SVT is discovered. Recommended she go to EMT substation or to our office during business hours if she has recurrent arrhythmia in an effort to identify her rhythm on EKG. If SVT, adenosine could be given.

## 2014-01-11 NOTE — Progress Notes (Signed)
Patient ID: Patricia [name], female    DOB: 1971/09/16, 43 y.o.   MRN: [id number]  HPI Comments: Patricia [name] is a pleasant 43 year old woman with obesity, prior episodes of tachycardia and a 7 times over the past several years, 3 episodes in the past 6 weeks, with long history of back surgeries and chronic back pain who presents to establish care.  She reports that she was previously on metoprolol tartrate 50 mg twice a day. On this regimen, her episodes of tachycardia was significantly improved. Initial workup several years ago by [facility] with echocardiogram, stress test, Holter. She feels that the Holter may have shown some of her tachycardia but is unsure of the details. On metoprolol, she only had short episodes of breakthrough tachycardia but much improved. This year she ran out of her metoprolol in the day refill. She does report having 3 severe episodes in the past 6 weeks, typically presenting at rest, not with exertion. One episode lasted several hours. When she has her tachycardia, she has shortness of breath, chest pain. Reports her heart rate is very elevated but unsure of the details.  She reports losing 50 pounds over the past several months by watching her diet. She is trying to lose more weight.  EKG shows normal sinus rhythm with rate 75 beats per minute with no significant ST or T wave changes    Outpatient Encounter Prescriptions as of 01/11/2014  Medication Sig  . estradiol (ESTRACE) 1 MG tablet Take 1.5 mg by mouth daily.  . traZODone (DESYREL) 150 MG tablet Take 150 mg by mouth at bedtime.  Marland Kitchen diltiazem (CARDIZEM) 30 MG tablet Take 1 tablet (30 mg total) by mouth 3 (three) times daily as needed (for tachycardia).  . metoprolol (LOPRESSOR) 50 MG tablet Take 1 tablet (50 mg total) by mouth 2 (two) times daily.  . propranolol (INDERAL) 20 MG tablet Take 1 tablet (20 mg total) by mouth 3 (three) times daily as needed (for tachycardia).    Review of Systems   Constitutional: Negative.   HENT: Negative.   Eyes: Negative.   Respiratory: Negative.   Cardiovascular: Positive for palpitations.       Tachycardia  Gastrointestinal: Negative.   Endocrine: Negative.   Musculoskeletal: Negative.   Skin: Negative.   Allergic/Immunologic: Negative.   Neurological: Negative.   Hematological: Negative.   Psychiatric/Behavioral: Negative.   All other systems reviewed and are negative.    BP 118/98  Pulse 75  Ht 5\' 5"  (1.651 m)  Wt 300 lb 8 oz (136.306 kg)  BMI 50.01 kg/m2  Physical Exam  Nursing note and vitals reviewed. Constitutional: She is oriented to person, place, and time. She appears well-developed and well-nourished.  HENT:  Head: Normocephalic.  Nose: Nose normal.  Mouth/Throat: Oropharynx is clear and moist.  Eyes: Conjunctivae are normal. Pupils are equal, round, and reactive to light.  Neck: Normal range of motion. Neck supple. No JVD present.  Cardiovascular: Normal rate, regular rhythm, S1 normal, S2 normal, normal heart sounds and intact distal pulses.  Exam reveals no gallop and no friction rub.   No murmur heard. Pulmonary/Chest: Effort normal and breath sounds normal. No respiratory distress. She has no wheezes. She has no rales. She exhibits no tenderness.  Abdominal: Soft. Bowel sounds are normal. She exhibits no distension. There is no tenderness.  Musculoskeletal: Normal range of motion. She exhibits no edema and no tenderness.  Lymphadenopathy:    She has no cervical adenopathy.  Neurological: She is alert  and oriented to person, place, and time. Coordination normal.  Skin: Skin is warm and dry. No rash noted. No erythema.  Psychiatric: She has a normal mood and affect. Her behavior is normal. Judgment and thought content normal.    Assessment and Plan

## 2014-01-16 ENCOUNTER — Ambulatory Visit: Payer: Self-pay | Admitting: Obstetrics and Gynecology

## 2014-06-24 ENCOUNTER — Other Ambulatory Visit: Payer: Self-pay | Admitting: Cardiovascular Disease

## 2015-02-14 NOTE — Op Note (Signed)
PATIENT NAME:  Patricia [name], [name] MR#:  [id number] DATE OF BIRTH:  01/10/1971  DATE OF PROCEDURE:  08/06/2013  PREOPERATIVE DIAGNOSES:  1.  Chronic pelvic pain.  2.  Menorrhagia.  3.  Severe dysmenorrhea.  4.  Family history of ovarian and breast cancer.   POSTOPERATIVE DIAGNOSES: 1.  Chronic pelvic pain.  2.  Menorrhagia.  3.  Severe dysmenorrhea.  4.  Family history of ovarian and breast cancer.  5.  Endometriosis.   OPERATIVE PROCEDURE: Laparoscopic assisted vaginal hysterectomy and bilateral salpingo-oophorectomy.   SURGEON: Herold HarmsMartin A DeFrancesco, M.D.   FIRST ASSISTANT: Dr. Hildred LaserAnika Cherry.  SECOND ASSISTNAT: Rudolpho Sevinaroline York, PA-S.   ANESTHESIA: General endotracheal.   INDICATIONS: The patient is a 44 year old, married, white female, para 1-0-0-1 with desire for definitive treatment due to presenting diagnoses.   FINDINGS AT SURGERY: Revealed a uterus with a small leiomyoma present. There was a right ovarian endometrioma measuring 5 cm in diameter upon inspection. Left tube and ovary were with normal with the exception of adhesions. Cul-de-sac had moderate adhesions.   DESCRIPTION OF THE PROCEDURE: The patient was brought to the operating room where she was placed in the supine position. General endotracheal anesthesia was induced without difficulty. She was placed in the dorsal lithotomy position using the bumblebee stirrups. A ChloraPrep and Betadine abdominal, perineal and intravaginal prep drape was performed in standard fashion. A Foley catheter was placed and was draining clear yellow urine. A Hulka tenaculum was placed on the cervix to facilitate uterine manipulation. Subumbilical vertical incision 5 mm in length was made. The Optiview laparoscopic trocar system was used to place a 5 mm port directly into the abdominopelvic cavity without evidence of bowel or vascular injury. Two other 5 mm ports were placed in the right and left lower quadrants respectively under direct  visualization. The above-noted findings were photo documented. The procedure was then performed in standard fashion. Round ligaments were clamped, coagulated and cut using the Harmonic scalpel. The infundibulopelvic ligaments were clamped, coagulated and cut using the Harmonic scalpel. The cardinal broad ligament complexes were then dissected to expose the uterine arteries. The uterine arteries were coagulated with Kleppinger bipolar forceps. This procedure was done bilaterally. The left pedicles had mild oozing, which required some Kleppinger bipolar cautery for hemostasis. The ureters were noted to be away from the operative field during procedure.   After completion of the abdominal aspect of the hysterectomy, the vaginal part was completed. The Hulka tenaculum  was removed and a double-tooth tenaculum was placed onto the cervix. A posterior weighted speculum was then placed. Posterior colpotomy was made with Mayo scissors. Uterosacral ligaments were clamped, cut, and stick tied using 0 Vicryl suture. These were tagged. There was difficulty entering the peritoneal cavity and the posterior colpotomy procedure due to adhesions. There was delayed entry into the cul-de-sac. Her anterior vaginal mucosa was circumscribed with the Bovie cautery and the vagina and bladder were dissected off the lower uterine segment through sharp and blunt dissection. The anterior cul-de-sac was eventually entered. The cardinal broad ligament complexes were clamped, cut and stick tied using 0 Vicryl sutures. Once adequately mobilized, and following entry into the posterior cul-de-sac, the uterus was delivered through the operative field. The remaining pedicles were clamped and stick tied using 0 Vicryl suture. Moderate oozing was noted from the posterior colpotomy site. The posterior vagina was closed using a running locking stitch of 0 chromic suture. The vaginal mucosa was then reapproximated using simple sutures of  2-0 chromic.  Once the vaginal closure was complete, repeat laparoscopy was performed to verify that all pedicles were hemostatic. The pelvis was irrigated and the irrigant fluid was aspirated. The abdomen was then closed with 4-0 Vicryl sutures in a simple interrupted manner. Dermabond glue was placed over the incisions. The patient was then awakened, extubated and taken to the recovery room in satisfactory condition.   ESTIMATED BLOOD LOSS: 500 mL.   IV FLUIDS: 1500 mL.  URINE OUTPUT: Not quantified at the time of this dictation.   COUNTS: All instruments, needle, and sponge counts were verified as correct.   ANTIBIOTICS: The patient did receive Ancef antibiotic prophylaxis.   ____________________________ Prentice Docker DeFrancesco, MD mad:aw D: 08/07/2013 08:33:21 ET T: 08/07/2013 08:49:29 ET JOB#: [id number]  cc: Daphine Deutscher A. DeFrancesco, MD, <Dictator> [facility] Prentice Docker DEFRANCESCO MD ELECTRONICALLY SIGNED 08/14/2013 13:21
